# Patient Record
Sex: Male | Born: 1990 | Race: White | Hispanic: No | Marital: Single | Smoking: Current every day smoker
Health system: Southern US, Community
[De-identification: ages and names within clinical notes are randomized; demographics above are authoritative.]

## PROBLEM LIST (undated history)

## (undated) ENCOUNTER — Emergency Department (HOSPITAL_BASED_OUTPATIENT_CLINIC_OR_DEPARTMENT_OTHER): Admission: EM | Payer: Self-pay | Source: Home / Self Care

---
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
## 2015-02-02 ENCOUNTER — Emergency Department (HOSPITAL_COMMUNITY)
Admission: EM | Admit: 2015-02-02 | Discharge: 2015-02-02 | Disposition: A | Discharge status: Home / Self Care | Payer: Self-pay | Attending: Emergency Medicine | Admitting: Emergency Medicine

## 2015-02-02 ENCOUNTER — Encounter (HOSPITAL_COMMUNITY): Payer: Self-pay | Admitting: Emergency Medicine

## 2015-02-02 DIAGNOSIS — Z720 Tobacco use: Secondary | ICD-10-CM | POA: Insufficient documentation

## 2015-02-02 DIAGNOSIS — M79674 Pain in right toe(s): Secondary | ICD-10-CM | POA: Insufficient documentation

## 2015-02-02 NOTE — Discharge Instructions (Signed)
Plantar Warts Warts are benign (noncancerous) growths of the outer skin layer. They can occur at any time in life but are most common during childhood and the teen years. Warts can occur on many skin surfaces of the body. When they occur on the underside (sole) of your foot they are called plantar warts. They often emerge in groups with several small warts encircling a larger growth. CAUSES  Human papillomavirus (HPV) is the cause of plantar warts. HPV attacks a break in the skin of the foot. Walking barefoot can lead to exposure to the wart virus. Plantar warts tend to develop over areas of pressure such as the heel and ball of the foot. Plantar warts often grow into the deeper layers of skin. They may spread to other areas of the sole but cannot spread to other areas of the body. SYMPTOMS  You may also notice a growth on the undersurface of your foot. The wart may grow directly into the sole of the foot, or rise above the surface of the skin on the sole of the foot, or both. They are most often flat from pressure. Warts generally do not cause itching but may cause pain in the area of the wart when you put weight on your foot. DIAGNOSIS  Diagnosis is made by physical examination. This means your caregiver discovers it while examining your foot.  TREATMENT  There are many ways to treat plantar warts. However, warts are very tough. Sometimes it is difficult to treat them so that they go away completely and do not grow back. Any treatment must be done regularly to work. If left untreated, most plantar warts will eventually disappear over a period of one to two years. Treatments you can do at home include:  Putting duct tape over the top of the wart (occlusion) has been found to be effective over several months. The duct tape should be removed each night and reapplied until the wart has disappeared.  Placing over-the-counter medications on top of the wart to help kill the wart virus and remove the wart  tissue (salicylic acid, cantharidin, and dichloroacetic acid) are useful. These are called keratolytic agents. These medications make the skin soft and gradually layers will shed away. These compounds are usually placed on the wart each night and then covered with a bandage. They are also available in premedicated bandage form. Avoid surrounding skin when applying these liquids as these medications can burn healthy skin. The treatment may take several months of nightly use to be effective.  Cryotherapy to freeze the wart has recently become available over-the-counter for children 4 years and older. This system makes use of a soft narrow applicator connected to a bottle of compressed cold liquid that is applied directly to the wart. This medication can burn healthy skin and should be used with caution.  As with all over-the-counter medications, read the directions carefully before use. Treatments generally done in your caregiver's office include:  Some aggressive treatments may cause discomfort, discoloration, and scarring of the surrounding skin. The risks and benefits of treatment should be discussed with your caregiver.  Freezing the wart with liquid nitrogen (cryotherapy, see above).  Burning the wart with use of very high heat (cautery).  Injecting medication into the wart.  Surgically removing or laser treatment of the wart.  Your caregiver may refer you to a dermatologist for difficult to treat large-sized warts or large numbers of warts. HOME CARE INSTRUCTIONS   Soak the affected area in warm water. Dry the  area completely when you are done. Remove the top layer of softened skin, then apply the chosen topical medication and reapply a bandage.  Remove the bandage daily and file excess wart tissue (pumice stone works well for this purpose). Repeat the entire process daily or every other day for weeks until the plantar wart disappears.  Several brands of salicylic acid pads are available  as over-the-counter remedies.  Pain can be relieved by wearing a donut bandage. This is a bandage with a hole in it. The bandage is put on with the hole over the wart. This helps take the pressure off the wart and gives pain relief. To help prevent plantar warts:  Wear shoes and socks and change them daily.  Keep feet clean and dry.  Check your feet and your children's feet regularly.  Avoid direct contact with warts on other people.  Have growths or changes on your skin checked by your caregiver. Document Released: 12/25/2003 Document Revised: 02/18/2014 Document Reviewed: 06/04/2009 [facility] Patient Information 2015 [place], [place]. This information is not intended to replace advice given to you by your health care provider. Make sure you discuss any questions you have with your health care provider. Corns and Calluses Corns are small areas of thickened skin that usually occur on the top, sides, or tip of a toe. They contain a cone-shaped core with a point that can press on a nerve below. This causes pain. Calluses are areas of thickened skin that usually develop on hands, fingers, palms, soles of the feet, and heels. These are areas that experience frequent friction or pressure. CAUSES  Corns are usually the result of rubbing (friction) or pressure from shoes that are too tight or do not fit properly. Calluses are caused by repeated friction and pressure on the affected areas. SYMPTOMS  A hard growth on the skin.  Pain or tenderness under the skin.  Sometimes, redness and swelling.  Increased discomfort while wearing tight-fitting shoes. DIAGNOSIS  Your caregiver can usually tell what the problem is by doing a physical exam. TREATMENT  Removing the cause of the friction or pressure is usually the only treatment needed. However, sometimes medicines can be used to help soften the hardened, thickened areas. These medicines include salicylic acid plasters and 12% ammonium lactate  lotion. These medicines should only be used under the direction of your caregiver. HOME CARE INSTRUCTIONS   Try to remove pressure from the affected area.  You may wear donut-shaped corn pads to protect your skin.  You may use a pumice stone or nonmetallic nail file to gently reduce the thickness of a corn.  Wear properly fitted footwear.  If you have calluses on the hands, wear gloves during activities that cause friction.  If you have diabetes, you should regularly examine your feet. Tell your caregiver if you notice any problems with your feet. SEEK IMMEDIATE MEDICAL CARE IF:   You have increased pain, swelling, redness, or warmth in the affected area.  Your corn or callus starts to drain fluid or bleeds.  You are not getting better, even with treatment. Document Released: 07/10/2004 Document Revised: 12/27/2011 Document Reviewed: 06/01/2011 [facility] Patient Information 2015 [place], [place]. This information is not intended to replace advice given to you by your health care provider. Make sure you discuss any questions you have with your health care provider.

## 2015-02-02 NOTE — ED Notes (Addendum)
Pt c/o R 4th toe pain x 2 months. Pt denies injury. Pain is on the bottom of toe. Pt ambulatory, A&Ox4. Pt denies bleeding. C/o some swelling and redness. Bottom of toe appears callused, possibly a wart or corn.

## 2015-02-02 NOTE — ED Provider Notes (Signed)
CSN: [id number]     Arrival date & time 02/02/15  1258 History  This chart was scribed for non-physician practitioner, Arthor Captain, PA-C, working with Doug Sou, MD, by Roxy Cedar ED Scribe. This patient was seen in room WTR5/WTR5 and the patient's care was started at 1:37 PM    Chief Complaint  Patient presents with  . Toe Pain   Patient is a 24 y.o. male presenting with toe pain. The history is provided by the patient. No language interpreter was used.  Toe Pain This is a new problem. The current episode started more than 1 week ago. The problem occurs constantly. The problem has not changed since onset.Pertinent negatives include no chest pain, no abdominal pain, no headaches and no shortness of breath. Nothing aggravates the symptoms. Nothing relieves the symptoms.   HPI Comments: Randy Galvan is a 24 y.o. male with no chronic medical conditions, who presents to the Emergency Department complaining of moderate, constant right foot pain to third toe that waxes and wanes initially onset 2 months ago with increased pain for the past few days. He reports that the pain is exacerbated by wearing shoes. He states he tried applying over the counter ointment with no relief.   History reviewed. No pertinent past medical history. History reviewed. No pertinent past surgical history. No family history on file. History  Substance Use Topics  . Smoking status: Current Every Day Smoker  . Smokeless tobacco: Not on file  . Alcohol Use: Yes   Review of Systems  Respiratory: Negative for shortness of breath.   Cardiovascular: Negative for chest pain.  Gastrointestinal: Negative for abdominal pain.  Skin:       Wart/callous to third right toe.  Neurological: Negative for headaches.  All other systems reviewed and are negative.  Allergies  Review of patient's allergies indicates no known allergies.  Home Medications   Prior to Admission medications   Not on File   Triage  Vitals: BP 130/96 mmHg  Pulse 58  Temp(Src) 98.1 F (36.7 C) (Oral)  Resp 16  SpO2 100%  Physical Exam  Constitutional: He is oriented to person, place, and time. He appears well-developed and well-nourished. No distress.  HENT:  Head: Normocephalic and atraumatic.  Neck: Normal range of motion.  Cardiovascular: Normal rate.   Pulmonary/Chest: Effort normal. No respiratory distress.  Abdominal: There is no tenderness.  Neurological: He is alert and oriented to person, place, and time. No cranial nerve deficit. Coordination normal.  Skin: No rash noted. He is not diaphoretic.  Circular 1cm diameter thickened callus on the plantar surface of the third right toe. No signs of infection. Moderately tender to palpation.   Psychiatric: He has a normal mood and affect. His behavior is normal.  Nursing note and vitals reviewed.  ED Course  Procedures (including critical care time)  DIAGNOSTIC STUDIES: Oxygen Saturation is 100% on RA, normal by my interpretation.    COORDINATION OF CARE: 1:41 PM- Discussed plans to discharge patient. Advised patient to be seen by Podiatrist for further evaluation. Pt advised of plan for treatment and pt agrees.  Labs Review Labs Reviewed - No data to display  Imaging Review No results found.   EKG Interpretation None     MDM   Final diagnoses:  Pain of toe of right foot    Patient with R 4th toe callus vs. Plantar wart. F/u with podiarty no signs of infection  I personally performed the services described in this documentation, which was scribed  in my presence. The recorded information has been reviewed and is accurate.     Arthor Captainbigail Cynthea Zachman, PA-C 02/02/15 1407  Doug SouSam Jacubowitz, MD 02/02/15 1534

## 2015-11-30 ENCOUNTER — Ambulatory Visit (INDEPENDENT_AMBULATORY_CARE_PROVIDER_SITE_OTHER): Payer: Self-pay | Admitting: Physician Assistant

## 2015-11-30 VITALS — BP 112/70 | HR 104 | Temp 99.2°F | Resp 18

## 2015-11-30 DIAGNOSIS — S61411A Laceration without foreign body of right hand, initial encounter: Secondary | ICD-10-CM

## 2015-11-30 DIAGNOSIS — Z23 Encounter for immunization: Secondary | ICD-10-CM
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
## 2015-11-30 NOTE — Patient Instructions (Signed)

## 2017-09-17 ENCOUNTER — Emergency Department (HOSPITAL_COMMUNITY): Payer: Self-pay

## 2017-09-17 ENCOUNTER — Encounter (HOSPITAL_COMMUNITY): Payer: Self-pay | Admitting: Nurse Practitioner

## 2017-09-17 ENCOUNTER — Emergency Department (HOSPITAL_COMMUNITY)
Admission: EM | Admit: 2017-09-17 | Discharge: 2017-09-17 | Disposition: A | Discharge status: Home / Self Care | Payer: Self-pay | Attending: Emergency Medicine | Admitting: Emergency Medicine

## 2017-09-17 DIAGNOSIS — F17200 Nicotine dependence, unspecified, uncomplicated: Secondary | ICD-10-CM | POA: Insufficient documentation

## 2017-09-17 DIAGNOSIS — S025XXB Fracture of tooth (traumatic), initial encounter for open fracture: Secondary | ICD-10-CM

## 2017-09-17 DIAGNOSIS — Y999 Unspecified external cause status: Secondary | ICD-10-CM | POA: Insufficient documentation

## 2017-09-17 DIAGNOSIS — S025XXA Fracture of tooth (traumatic), initial encounter for closed fracture: Secondary | ICD-10-CM | POA: Insufficient documentation

## 2017-09-17 DIAGNOSIS — Y9389 Activity, other specified: Secondary | ICD-10-CM | POA: Insufficient documentation

## 2017-09-17 DIAGNOSIS — Y929 Unspecified place or not applicable: Secondary | ICD-10-CM | POA: Insufficient documentation

## 2017-09-17 MED ORDER — FENTANYL CITRATE (PF) 100 MCG/2ML IJ SOLN
50.0000 ug | Freq: Once | INTRAMUSCULAR | Status: AC
  Administered 2017-09-17: 50 ug via INTRAVENOUS

## 2017-09-17 MED ORDER — FENTANYL CITRATE (PF) 100 MCG/2ML IJ SOLN
INTRAMUSCULAR | Status: AC
  Filled 2017-09-17: qty 2

## 2017-09-17 MED ORDER — BUPIVACAINE-EPINEPHRINE (PF) 0.5% -1:200000 IJ SOLN
INTRAMUSCULAR | Status: AC
  Administered 2017-09-17: 03:00:00
  Filled 2017-09-17: qty 3.6

## 2017-09-17 MED ORDER — HYDROCODONE-ACETAMINOPHEN 5-325 MG PO TABS: 1.0000 | ORAL_TABLET | Freq: Four times a day (QID) | ORAL | 0 refills | Status: AC | PRN

## 2017-09-17 MED ORDER — BUPIVACAINE-EPINEPHRINE (PF) 0.5% -1:200000 IJ SOLN
INTRAMUSCULAR | Status: AC
  Filled 2017-09-17: qty 1.8
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
## 2017-09-17 NOTE — ED Notes (Signed)
Oral surgeon at bedside.
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
## 2017-09-17 NOTE — Discharge Instructions (Signed)
Please apply ice to the area as needed.  Please rinse your mouth with salt water rinses after meals and before bed.  Please follow-up with Dr. Barbette MerinoJensen in 1 week.
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
## 2018-08-11 ENCOUNTER — Encounter (HOSPITAL_COMMUNITY): Payer: Self-pay | Admitting: Emergency Medicine

## 2018-08-11 ENCOUNTER — Emergency Department (HOSPITAL_COMMUNITY)
Admission: EM | Admit: 2018-08-11 | Discharge: 2018-08-11 | Disposition: A | Discharge status: Home / Self Care | Payer: Self-pay | Attending: Emergency Medicine | Admitting: Emergency Medicine

## 2018-08-11 DIAGNOSIS — R002 Palpitations: Secondary | ICD-10-CM | POA: Insufficient documentation

## 2018-08-11 DIAGNOSIS — F17210 Nicotine dependence, cigarettes, uncomplicated: Secondary | ICD-10-CM | POA: Insufficient documentation

## 2018-08-11 LAB — I-STAT CHEM 8, ED
BUN: 16 mg/dL (ref 6–20)
CALCIUM ION: 1.15 mmol/L (ref 1.15–1.40)
CHLORIDE: 104 mmol/L (ref 98–111)
Creatinine, Ser: 1.1 mg/dL (ref 0.61–1.24)
GLUCOSE: 95 mg/dL (ref 70–99)
HCT: 45 % (ref 39.0–52.0)
Hemoglobin: 15.3 g/dL (ref 13.0–17.0)
Potassium: 4 mmol/L (ref 3.5–5.1)
SODIUM: 139 mmol/L (ref 135–145)
TCO2: 24 mmol/L (ref 22–32)

## 2018-08-11 NOTE — ED Provider Notes (Signed)
[facility]-EMERGENCY DEPT Provider Note   CSN: [id number] Arrival date & time: 08/11/18  1129     History   Chief Complaint Chief Complaint  Patient presents with  . Palpitations    HPI Randy Galvan is a 27 y.o. male.  The history is provided by the patient.  Palpitations   This is a new problem. The current episode started more than 1 week ago. The problem occurs daily. Progression since onset: intermittent. The problem is associated with an unknown factor. Pertinent negatives include no diaphoresis, no fever, no malaise/fatigue, no numbness, no chest pain, no chest pressure, no claudication, no exertional chest pressure, no irregular heartbeat, no near-syncope, no orthopnea, no PND, no syncope, no abdominal pain, no nausea, no vomiting, no headaches, no back pain, no leg pain, no lower extremity edema, no dizziness, no weakness, no cough, no hemoptysis, no shortness of breath and no sputum production. He has tried nothing for the symptoms. The treatment provided no relief. There are no known risk factors. His past medical history does not include anemia or heart disease.    History reviewed. No pertinent past medical history.  There are no active problems to display for this patient.   History reviewed. No pertinent surgical history.      Home Medications    Prior to Admission medications   Medication Sig Start Date End Date Taking? Authorizing Provider  HYDROcodone-acetaminophen (NORCO/VICODIN) 5-325 MG tablet Take 1-2 tablets by mouth every 6 (six) hours as needed. 09/17/17   Roxy Horseman, PA-C    Family History No family history on file.  Social History Social History   Tobacco Use  . Smoking status: Current Every Day Smoker    Types: Cigarettes  . Smokeless tobacco: Never Used  Substance Use Topics  . Alcohol use: Yes  . Drug use: Yes    Types: Marijuana     Allergies   Patient has no known allergies.   Review of  Systems Review of Systems  Constitutional: Negative for chills, diaphoresis, fever and malaise/fatigue.  HENT: Negative for ear pain and sore throat.   Eyes: Negative for pain and visual disturbance.  Respiratory: Negative for cough, hemoptysis, sputum production and shortness of breath.   Cardiovascular: Positive for palpitations. Negative for chest pain, orthopnea, claudication, syncope, PND and near-syncope.  Gastrointestinal: Negative for abdominal pain, nausea and vomiting.  Genitourinary: Negative for dysuria and hematuria.  Musculoskeletal: Negative for arthralgias and back pain.  Skin: Negative for color change and rash.  Neurological: Negative for dizziness, seizures, syncope, weakness, numbness and headaches.  All other systems reviewed and are negative.    Physical Exam Updated Vital Signs BP 137/81 (BP Location: Left Arm)   Pulse 66   Temp 97.6 F (36.4 C) (Oral)   Resp 17   Ht 5\' 9"  (1.753 m)   Wt 72.6 kg   SpO2 100%   BMI 23.63 kg/m   Physical Exam  Constitutional: He appears well-developed and well-nourished.  HENT:  Head: Normocephalic and atraumatic.  Eyes: Pupils are equal, round, and reactive to light. Conjunctivae and EOM are normal.  Neck: Normal range of motion. Neck supple.  Cardiovascular: Normal rate, regular rhythm, normal heart sounds and intact distal pulses.  No murmur heard. Pulmonary/Chest: Effort normal and breath sounds normal. No respiratory distress.  Abdominal: Soft. Bowel sounds are normal. He exhibits no distension. There is no tenderness.  Musculoskeletal: He exhibits no edema.  Neurological: He is alert.  Skin: Skin is warm and dry.  Capillary refill takes less than 2 seconds.  Psychiatric: He has a normal mood and affect.  Nursing note and vitals reviewed.    ED Treatments / Results  Labs (all labs ordered are listed, but only abnormal results are displayed) Labs Reviewed  I-STAT CHEM 8, ED    EKG EKG  Interpretation  Date/Time:  Friday August 11 2018 11:38:36 EDT Ventricular Rate:  74 PR Interval:    QRS Duration: 90 QT Interval:  380 QTC Calculation: 422 R Axis:   63 Text Interpretation:  Sinus rhythm Confirmed by Virgina Norfolk [phone]) on 08/11/2018 11:41:04 AM   Radiology No results found.  Procedures Procedures (including critical care time)  Medications Ordered in ED Medications - No data to display   Initial Impression / Assessment and Plan / ED Course  I have reviewed the triage vital signs and the nursing notes.  Pertinent labs & imaging results that were available during my care of the patient were reviewed by me and considered in my medical decision making (see chart for details).     Randy Galvan is a 27 year old male with no significant medical history who presents to the ED with palpitations.  Patient with normal vitals.  No fever.  EKG upon arrival shows sinus rhythm.  No signs of ischemic changes.  No obvious signs of HOCM, Brugada, Wolff-Parkinson-White, no prolongation of QTC.  Patient upon my evaluation is asymptomatic.  Has had some intermittent palpitations over the past week that was slightly worse this morning.  Denies any caffeine, alcohol, drug use.  Denies any syncope, chest pain, shortness of breath.  No history of sudden cardiac death in the family.  Symptoms do not worsen with exertion.  Upon cardiac monitoring while in the room it appeared that the patient did have some PVCs.  However was unable to capture it on EKG.  Suspect that this is likely the cause of his palpitations.  Blood work showed no significant anemia, electrolyte abnormality, kidney injury.  Educated patient on PVCs, encourage hydration and withholding of any caffeinated beverages.  Given information to follow-up with primary care doctor or cardiology for heart monitor.  Discharged from ED in good condition.  Given return precautions.  This chart was dictated using voice recognition  software.  Despite best efforts to proofread,  errors can occur which can change the documentation meaning.  Final Clinical Impressions(s) / ED Diagnoses   Final diagnoses:  Palpitations    ED Discharge Orders    None       Virgina Norfolk, DO 08/11/18 1254

## 2018-08-11 NOTE — ED Notes (Addendum)
Pt comfortable. Family at bedside

## 2018-08-11 NOTE — ED Triage Notes (Signed)
Pt reports that been having heart palpitations for past week. Will happen at different times, either after smoking or waking up in mornings.

## 2019-07-11 ENCOUNTER — Other Ambulatory Visit: Payer: Self-pay

## 2019-07-11 DIAGNOSIS — Z20822 Contact with and (suspected) exposure to covid-19: Secondary | ICD-10-CM

## 2019-07-13 LAB — NOVEL CORONAVIRUS, NAA: SARS-CoV-2, NAA: NOT DETECTED

## 2020-04-11 ENCOUNTER — Other Ambulatory Visit: Payer: Self-pay

## 2020-04-11 ENCOUNTER — Emergency Department (HOSPITAL_COMMUNITY)
Admission: EM | Admit: 2020-04-11 | Discharge: 2020-04-11 | Disposition: A | Discharge status: Home / Self Care | Payer: Self-pay | Attending: Emergency Medicine | Admitting: Emergency Medicine

## 2020-04-11 ENCOUNTER — Encounter (HOSPITAL_COMMUNITY): Payer: Self-pay | Admitting: Emergency Medicine

## 2020-04-11 ENCOUNTER — Emergency Department (HOSPITAL_COMMUNITY): Payer: Self-pay

## 2020-04-11 DIAGNOSIS — W458XXA Other foreign body or object entering through skin, initial encounter: Secondary | ICD-10-CM | POA: Insufficient documentation

## 2020-04-11 DIAGNOSIS — F1290 Cannabis use, unspecified, uncomplicated: Secondary | ICD-10-CM | POA: Insufficient documentation

## 2020-04-11 DIAGNOSIS — Y92810 Car as the place of occurrence of the external cause: Secondary | ICD-10-CM | POA: Insufficient documentation

## 2020-04-11 DIAGNOSIS — Y939 Activity, unspecified: Secondary | ICD-10-CM | POA: Insufficient documentation

## 2020-04-11 DIAGNOSIS — S60450A Superficial foreign body of right index finger, initial encounter: Secondary | ICD-10-CM | POA: Insufficient documentation

## 2020-04-11 DIAGNOSIS — F17210 Nicotine dependence, cigarettes, uncomplicated: Secondary | ICD-10-CM | POA: Insufficient documentation

## 2020-04-11 DIAGNOSIS — S6991XA Unspecified injury of right wrist, hand and finger(s), initial encounter: Secondary | ICD-10-CM

## 2020-04-11 DIAGNOSIS — Y999 Unspecified external cause status: Secondary | ICD-10-CM | POA: Insufficient documentation

## 2020-04-11 MED ORDER — LIDOCAINE HCL (PF) 1 % IJ SOLN
10.0000 mL | Freq: Once | INTRAMUSCULAR | Status: AC
  Administered 2020-04-11: 10 mL
  Filled 2020-04-11: qty 30

## 2020-04-11 MED ORDER — CEPHALEXIN 500 MG PO CAPS: 500.0000 mg | ORAL_CAPSULE | Freq: Four times a day (QID) | ORAL | 0 refills | Status: AC

## 2020-04-11 NOTE — ED Triage Notes (Signed)
Patient has a fishing lure in right pointer finger

## 2020-04-11 NOTE — Discharge Instructions (Signed)
Take antibiotics as prescribed.  Take entire course, even if your symptoms improve. Use Tylenol or ibuprofen as needed for pain. Wash daily twice a day with soap and water. Use ice to help with pain and swelling. Return to the emergency room if you develop fevers, severe worsening pain, inability to completely straighten your finger, red streaking into your hand or any new, worsening, or concerning symptoms.

## 2020-04-11 NOTE — ED Provider Notes (Signed)
[facility]-EMERGENCY DEPT Provider Note   CSN: [id number] Arrival date & time: 04/11/20  1134     History Chief Complaint  Patient presents with  . hook in finger    Randy Galvan is a 29 y.o. male presenting for evaluation of fishhook stuck in finger.  Patient states just prior to arrival he was trying to get a fishhook out in his car seat when he accidentally got stuck in his right index finger.  He was able to remove 2 of the 3 barbs from his finger, but the last barb is stuck in the distal aspect of his right index finger.  He reports pain at the site.  No numbness.  He denies injury elsewhere.  He is not sure if his tetanus is up-to-date.  He has no other medical problems, takes no medications daily.  Additional history obtained from chart review.  Tdap updated in 2017.  HPI     History reviewed. No pertinent past medical history.  There are no problems to display for this patient.   History reviewed. No pertinent surgical history.     No family history on file.  Social History   Tobacco Use  . Smoking status: Current Every Day Smoker    Types: Cigarettes  . Smokeless tobacco: Never Used  Vaping Use  . Vaping Use: Never used  Substance Use Topics  . Alcohol use: Yes  . Drug use: Yes    Types: Marijuana    Home Medications Prior to Admission medications   Medication Sig Start Date End Date Taking? Authorizing Provider  cephALEXin (KEFLEX) 500 MG capsule Take 1 capsule (500 mg total) by mouth 4 (four) times daily. 04/11/20   Chanita Boden, PA-C  HYDROcodone-acetaminophen (NORCO/VICODIN) 5-325 MG tablet Take 1-2 tablets by mouth every 6 (six) hours as needed. 09/17/17   Roxy Horseman, PA-C    Allergies    Patient has no known allergies.  Review of Systems   Review of Systems  Skin: Positive for wound (r index finger).    Physical Exam Updated Vital Signs BP 130/72 (BP Location: Left Arm)   Pulse 78   Temp 98.7 F (37.1  C) (Oral)   Resp 16   SpO2 99%   Physical Exam Vitals and nursing note reviewed.  Constitutional:      General: He is not in acute distress.    Appearance: He is well-developed.  HENT:     Head: Normocephalic and atraumatic.  Pulmonary:     Effort: Pulmonary effort is normal.  Abdominal:     General: There is no distension.  Musculoskeletal:        General: Normal range of motion.     Cervical back: Normal range of motion.     Comments: Fishhook stuck and distal right index finger.  2 small puncture wounds at the lateral base of the index finger from the other 2 barbs.  No active bleeding.  Full active range of motion of the finger.  Good distal sensation and cap refill.  No injury noted elsewhere in the hand.  Skin:    General: Skin is warm.     Capillary Refill: Capillary refill takes less than 2 seconds.     Findings: No rash.  Neurological:     Mental Status: He is alert and oriented to person, place, and time.     ED Results / Procedures / Treatments   Labs (all labs ordered are listed, but only abnormal results are displayed) Labs Reviewed -  No data to display  EKG None  Radiology DG Finger Index Right  Result Date: 04/11/2020 CLINICAL DATA:  Fishhook stuck in index finger EXAM: RIGHT INDEX FINGER 2+V COMPARISON:  None FINDINGS: Osseous mineralization normal. Joint spaces preserved. Large metallic foreign body consistent with a facial with 3 barbs identified within volar soft tissues at the level of the DIP joint RIGHT index finger. No fracture, dislocation, or bone destruction. IMPRESSION: No acute osseous abnormalities. Large 3 barbed fishhook within volar soft tissues at the level of the DIP joint, RIGHT index finger. Electronically Signed   By: Lavonia Dana M.D.   On: 04/11/2020 13:26    Procedures .Foreign Body Removal  Date/Time: 04/11/2020 6:39 PM Performed by: Franchot Heidelberg, PA-C Authorized by: Franchot Heidelberg, PA-C  Intake: R index  finger. Anesthesia: digital block  Anesthesia: Local Anesthetic: lidocaine 1% without epinephrine Anesthetic total: 5 mL  Sedation: Patient sedated: no  Patient restrained: no Complexity: simple 1 objects recovered. Objects recovered: fish hook Post-procedure assessment: foreign body removed Patient tolerance: patient tolerated the procedure well with no immediate complications Comments: Digital block performed with tendon sheath approach. Hook cut with wire cutters and pushed through to be removed.    (including critical care time)  Medications Ordered in ED Medications  lidocaine (PF) (XYLOCAINE) 1 % injection 10 mL (10 mLs Infiltration Given 04/11/20 1259)    ED Course  I have reviewed the triage vital signs and the nursing notes.  Pertinent labs & imaging results that were available during my care of the patient were reviewed by me and considered in my medical decision making (see chart for details).    MDM Rules/Calculators/A&P                          Patient presenting for fishhook injury of the right finger.  On exam, patient was noted in patient's finger.  He is neurovascularly intact.  He has 2 small puncture wounds at the base of his finger from where the other barbs were.  X-ray viewed interpreted by me, no fracture foreign body.  Fishhook removed as described above, patient tolerated well.  As he has 3 puncture wounds on the palmar aspect of his finger, will cover with antibiotics prevent flexor tendinitis.  Discussed aftercare instructions.  Discussed prompt return to the ER with signs of worsening infection.  At this time, patient appears safe for discharge.  Return precautions given.  Patient states he understands and agrees to plan.  Final Clinical Impression(s) / ED Diagnoses Final diagnoses:  Fish hook injury of finger of right hand, initial encounter    Rx / DC Orders ED Discharge Orders         Ordered    cephALEXin (KEFLEX) 500 MG capsule  4 times  daily     Discontinue  Reprint     04/11/20 1411           Franchot Heidelberg, PA-C 04/11/20 1841    Gareth Morgan, MD 04/12/20 1513

## 2021-02-24 IMAGING — CR DG FINGER INDEX 2+V*R*
3 series · 3 of 3 positions shown · non-contrast
Comparison: None

CLINICAL DATA: Fishhook stuck in index finger

EXAM:
RIGHT INDEX FINGER 2+V

[x finger pa right]
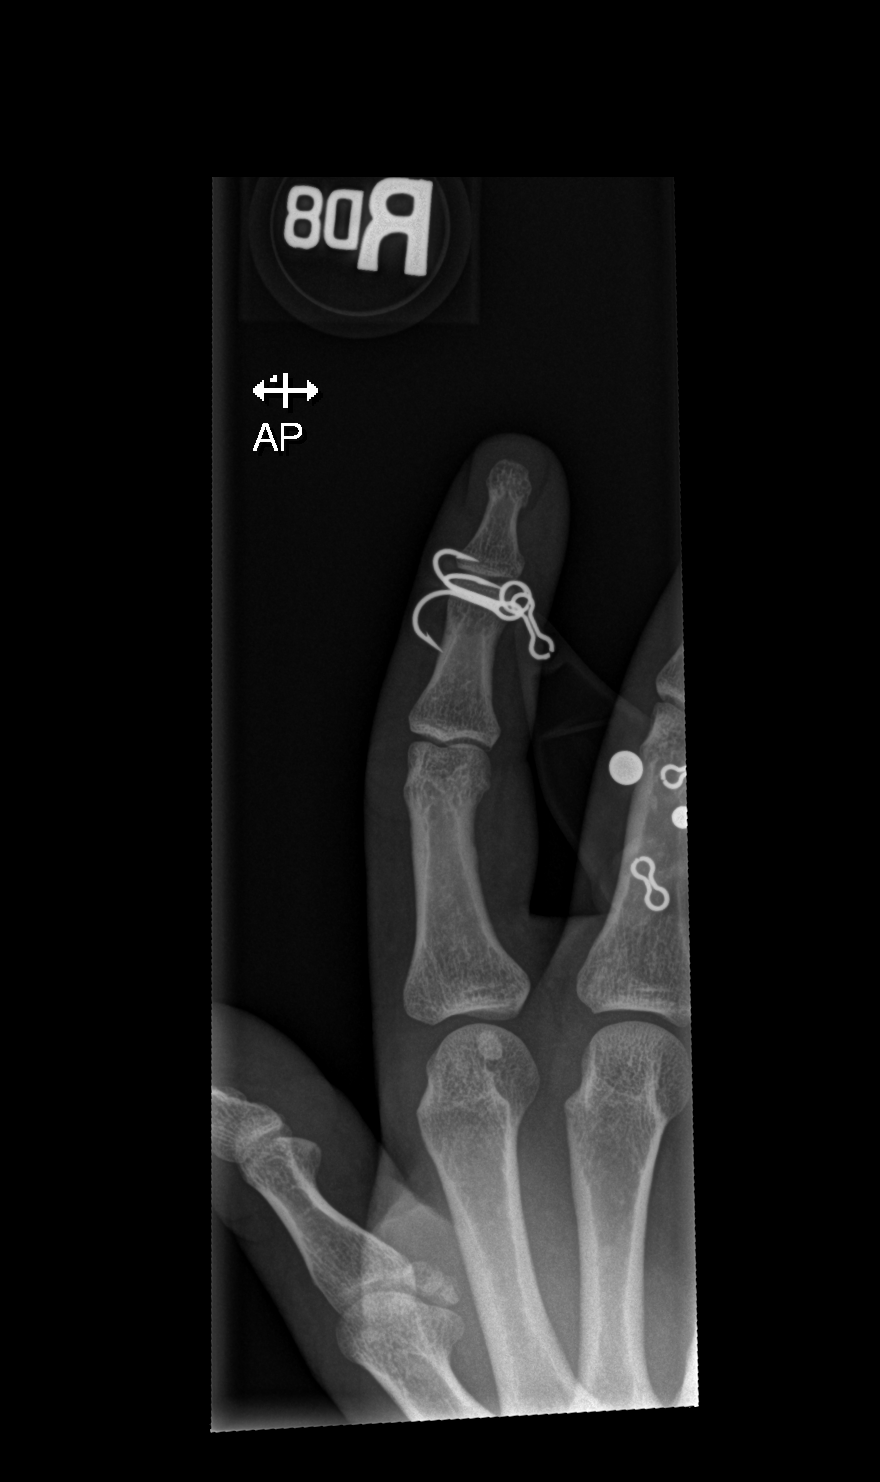

[x finger obl right]
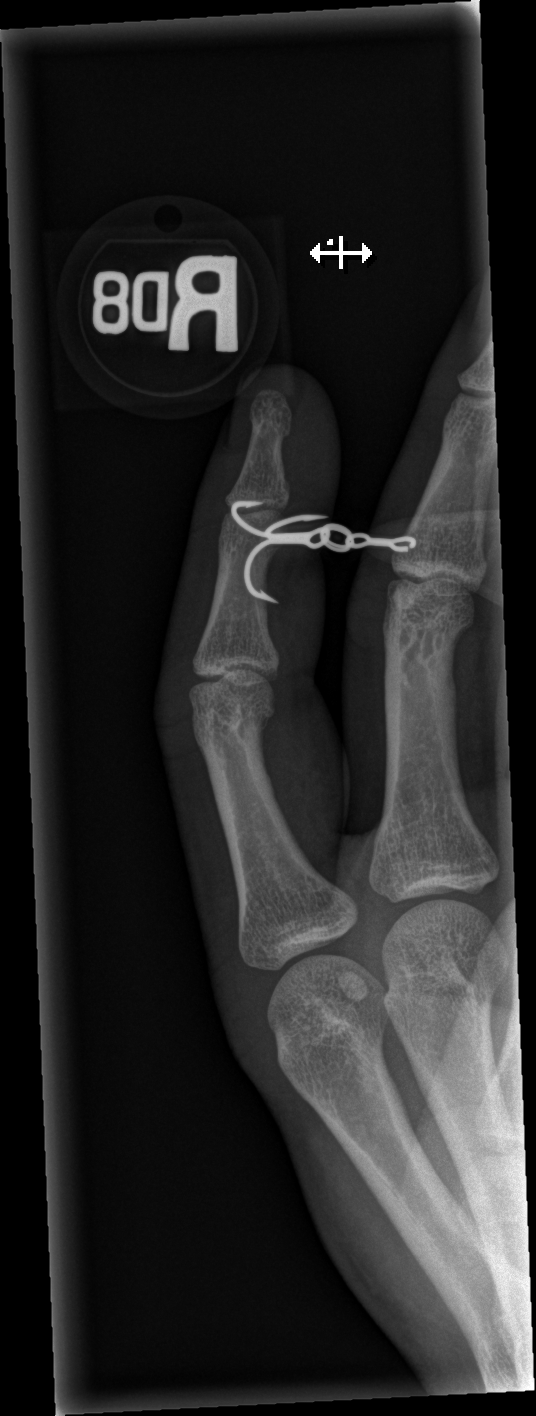

[x finger lat right]
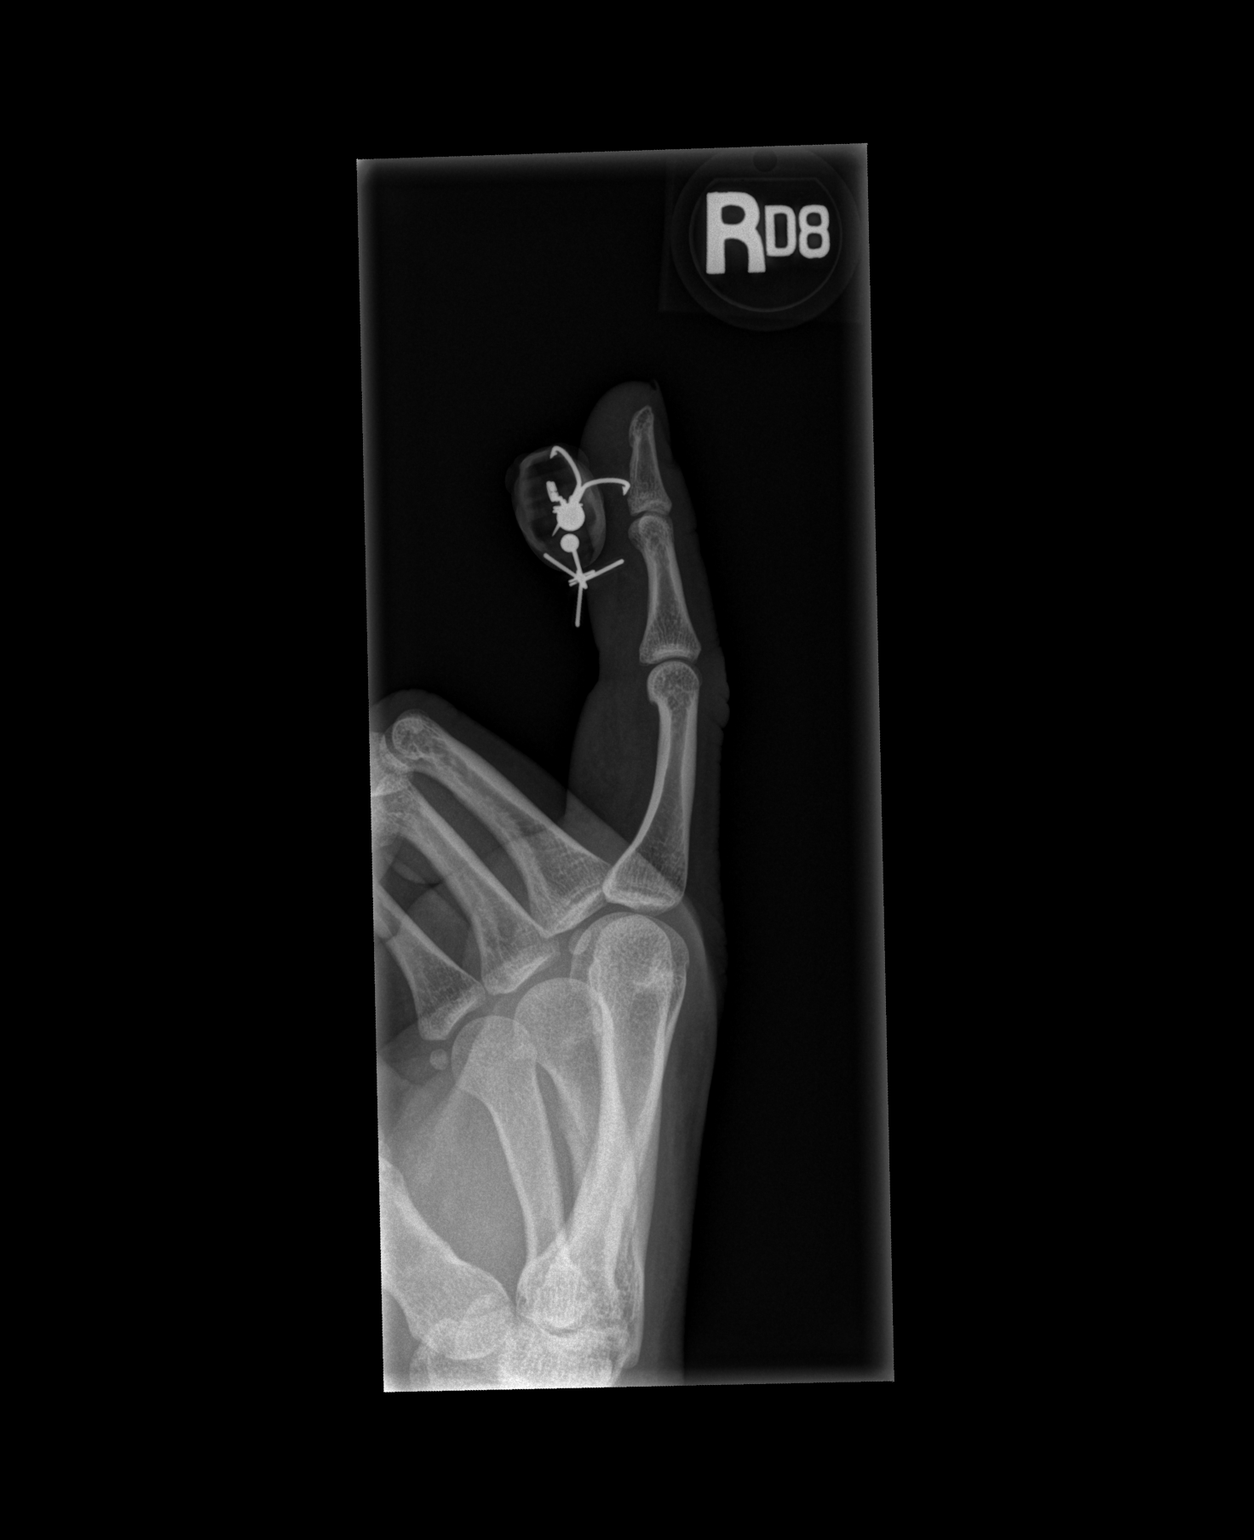

[3 of 3 positions shown; findings below may reference images not displayed]

FINDINGS: Osseous mineralization normal.

Joint spaces preserved.

Large metallic foreign body consistent with a facial with 3 barbs
identified within volar soft tissues at the level of the DIP joint
RIGHT index finger.

No fracture, dislocation, or bone destruction.
IMPRESSION: No acute osseous abnormalities.

Large 3 barbed fishhook within volar soft tissues at the level of
the DIP joint, RIGHT index finger.

## 2021-07-30 ENCOUNTER — Other Ambulatory Visit: Payer: Self-pay

## 2021-07-30 NOTE — ED Notes (Signed)
Called x 2 to triage room, pt not in the waiting room , pt stepped out of the department for phone call per registration staff.
